# Patient Record
Sex: Male | Born: 1970 | Race: Black or African American | Hispanic: No | Marital: Married | Smoking: Never smoker
Health system: Southern US, Community
[De-identification: ages and names within clinical notes are randomized; demographics above are authoritative.]

---

## 2015-07-29 ENCOUNTER — Telehealth: Payer: Self-pay | Admitting: Behavioral Health

## 2015-07-29 NOTE — Telephone Encounter (Signed)
Unable to reach patient at time of Pre-Visit Call.  Left message for patient to return call when available.    

## 2015-08-01 ENCOUNTER — Ambulatory Visit (INDEPENDENT_AMBULATORY_CARE_PROVIDER_SITE_OTHER): Payer: BLUE CROSS/BLUE SHIELD | Admitting: Medical

## 2015-08-01 ENCOUNTER — Telehealth: Payer: Self-pay | Admitting: Medical

## 2015-08-01 ENCOUNTER — Encounter: Payer: Self-pay | Admitting: Medical

## 2015-08-01 VITALS — BP 110/70 | HR 67 | Temp 97.4°F | Ht 67.0 in | Wt 181.8 lb

## 2015-08-01 DIAGNOSIS — Z0000 Encounter for general adult medical examination without abnormal findings: Secondary | ICD-10-CM

## 2015-08-01 DIAGNOSIS — Z113 Encounter for screening for infections with a predominantly sexual mode of transmission: Secondary | ICD-10-CM

## 2015-08-01 LAB — LIPID PANEL
CHOL/HDL RATIO: 4
Cholesterol: 184 mg/dL (ref 0–200)
HDL: 50.4 mg/dL (ref >39.00)
LDL Cholesterol: 113 mg/dL — ABNORMAL HIGH (ref 0–99)
NONHDL: 133.58
TRIGLYCERIDES: 105 mg/dL (ref 0.0–149.0)
VLDL: 21 mg/dL (ref 0.0–40.0)

## 2015-08-01 LAB — COMPREHENSIVE METABOLIC PANEL
ALBUMIN: 4.5 g/dL (ref 3.5–5.2)
ALK PHOS: 39 U/L (ref 39–117)
ALT: 26 U/L (ref 0–53)
AST: 26 U/L (ref 0–37)
BILIRUBIN TOTAL: 0.9 mg/dL (ref 0.2–1.2)
BUN: 10 mg/dL (ref 6–23)
CO2: 29 mEq/L (ref 19–32)
Calcium: 9.6 mg/dL (ref 8.4–10.5)
Chloride: 104 mEq/L (ref 96–112)
Creatinine, Ser: 1.19 mg/dL (ref 0.40–1.50)
GFR: 84.83 mL/min (ref >60.00)
Glucose, Bld: 95 mg/dL (ref 70–99)
POTASSIUM: 4 meq/L (ref 3.5–5.1)
Sodium: 139 mEq/L (ref 135–145)
TOTAL PROTEIN: 7.3 g/dL (ref 6.0–8.3)

## 2015-08-01 LAB — CBC WITH DIFFERENTIAL/PLATELET
Basophils Absolute: 0 10*3/uL (ref 0.0–0.1)
Basophils Relative: 0.8 % (ref 0.0–3.0)
EOS PCT: 1.5 % (ref 0.0–5.0)
Eosinophils Absolute: 0.1 10*3/uL (ref 0.0–0.7)
HEMATOCRIT: 45.2 % (ref 39.0–52.0)
HEMOGLOBIN: 14.4 g/dL (ref 13.0–17.0)
LYMPHS ABS: 1.6 10*3/uL (ref 0.7–4.0)
LYMPHS PCT: 42.6 % (ref 12.0–46.0)
MCHC: 31.8 g/dL (ref 30.0–36.0)
MCV: 83.4 fl (ref 78.0–100.0)
MONOS PCT: 7 % (ref 3.0–12.0)
Monocytes Absolute: 0.3 10*3/uL (ref 0.1–1.0)
Neutro Abs: 1.8 10*3/uL (ref 1.4–7.7)
Neutrophils Relative %: 48.1 % (ref 43.0–77.0)
Platelets: 192 10*3/uL (ref 150.0–400.0)
RBC: 5.42 Mil/uL (ref 4.22–5.81)
RDW: 13.9 % (ref 11.5–15.5)
WBC: 3.8 10*3/uL — AB (ref 4.0–10.5)

## 2015-08-01 LAB — HIV ANTIBODY (ROUTINE TESTING W REFLEX): HIV 1&2 Ab, 4th Generation: NONREACTIVE

## 2015-08-01 LAB — TSH: TSH: 1.41 u[IU]/mL (ref 0.35–4.50)

## 2015-08-01 NOTE — Progress Notes (Signed)
Pre visit review using our clinic review tool, if applicable. No additional management support is needed unless otherwise documented below in the visit note. 

## 2015-08-01 NOTE — Patient Instructions (Addendum)
Wellness examination Cbc, cmp, tsh, lipid, hiv, ua today.   Pt will get tdap today. Hiv screening as well. Put in referral to eye MD.   Follow up date to be determined post lab review.  Preventive Care for Adults, Male A healthy lifestyle and preventive care can promote health and wellness. Preventive health guidelines for men include the following key practices:  A routine yearly physical is a good way to check with your health care provider about your health and preventative screening. It is a chance to share any concerns and updates on your health and to receive a thorough exam.  Visit your dentist for a routine exam and preventative care every 6 months. Brush your teeth twice a day and floss once a day. Good oral hygiene prevents tooth decay and gum disease.  The frequency of eye exams is based on your age, health, family medical history, use of contact lenses, and other factors. Follow your health care provider's recommendations for frequency of eye exams.  Eat a healthy diet. Foods such as vegetables, fruits, whole grains, low-fat dairy products, and lean protein foods contain the nutrients you need without too many calories. Decrease your intake of foods high in solid fats, added sugars, and salt. Eat the right amount of calories for you.Get information about a proper diet from your health care provider, if necessary.  Regular physical exercise is one of the most important things you can do for your health. Most adults should get at least 150 minutes of moderate-intensity exercise (any activity that increases your heart rate and causes you to sweat) each week. In addition, most adults need muscle-strengthening exercises on 2 or more days a week.  Maintain a healthy weight. The body mass index (BMI) is a screening tool to identify possible weight problems. It provides an estimate of body fat based on height and weight. Your health care provider can find your BMI and can help you achieve  or maintain a healthy weight.For adults 20 years and older:  A BMI below 18.5 is considered underweight.  A BMI of 18.5 to 24.9 is normal.  A BMI of 25 to 29.9 is considered overweight.  A BMI of 30 and above is considered obese.  Maintain normal blood lipids and cholesterol levels by exercising and minimizing your intake of saturated fat. Eat a balanced diet with plenty of fruit and vegetables. Blood tests for lipids and cholesterol should begin at age 26 and be repeated every 5 years. If your lipid or cholesterol levels are high, you are over 50, or you are at high risk for heart disease, you may need your cholesterol levels checked more frequently.Ongoing high lipid and cholesterol levels should be treated with medicines if diet and exercise are not working.  If you smoke, find out from your health care provider how to quit. If you do not use tobacco, do not start.  Lung cancer screening is recommended for adults aged 39-80 years who are at high risk for developing lung cancer because of a history of smoking. A yearly low-dose CT scan of the lungs is recommended for people who have at least a 30-pack-year history of smoking and are a current smoker or have quit within the past 15 years. A pack year of smoking is smoking an average of 1 pack of cigarettes a day for 1 year (for example: 1 pack a day for 30 years or 2 packs a day for 15 years). Yearly screening should continue until the smoker has stopped  smoking for at least 15 years. Yearly screening should be stopped for people who develop a health problem that would prevent them from having lung cancer treatment.  If you choose to drink alcohol, do not have more than 2 drinks per day. One drink is considered to be 12 ounces (355 mL) of beer, 5 ounces (148 mL) of wine, or 1.5 ounces (44 mL) of liquor.  Avoid use of street drugs. Do not share needles with anyone. Ask for help if you need support or instructions about stopping the use of  drugs.  High blood pressure causes heart disease and increases the risk of stroke. Your blood pressure should be checked at least every 1-2 years. Ongoing high blood pressure should be treated with medicines, if weight loss and exercise are not effective.  If you are 59-7 years old, ask your health care provider if you should take aspirin to prevent heart disease.  Diabetes screening is done by taking a blood sample to check your blood glucose level after you have not eaten for a certain period of time (fasting). If you are not overweight and you do not have risk factors for diabetes, you should be screened once every 3 years starting at age 73. If you are overweight or obese and you are 42-60 years of age, you should be screened for diabetes every year as part of your cardiovascular risk assessment.  Colorectal cancer can be detected and often prevented. Most routine colorectal cancer screening begins at the age of 73 and continues through age 60. However, your health care provider may recommend screening at an earlier age if you have risk factors for colon cancer. On a yearly basis, your health care provider may provide home test kits to check for hidden blood in the stool. Use of a small camera at the end of a tube to directly examine the colon (sigmoidoscopy or colonoscopy) can detect the earliest forms of colorectal cancer. Talk to your health care provider about this at age 42, when routine screening begins. Direct exam of the colon should be repeated every 5-10 years through age 44, unless early forms of precancerous polyps or small growths are found.  People who are at an increased risk for hepatitis B should be screened for this virus. You are considered at high risk for hepatitis B if:  You were born in a country where hepatitis B occurs often. Talk with your health care provider about which countries are considered high risk.  Your parents were born in a high-risk country and you have not  received a shot to protect against hepatitis B (hepatitis B vaccine).  You have HIV or AIDS.  You use needles to inject street drugs.  You live with, or have sex with, someone who has hepatitis B.  You are a man who has sex with other men (MSM).  You get hemodialysis treatment.  You take certain medicines for conditions such as cancer, organ transplantation, and autoimmune conditions.  Hepatitis C blood testing is recommended for all people born from [birth year] through 1965 and any individual with known risks for hepatitis C.  Practice safe sex. Use condoms and avoid high-risk sexual practices to reduce the spread of sexually transmitted infections (STIs). STIs include gonorrhea, chlamydia, syphilis, trichomonas, herpes, HPV, and human immunodeficiency virus (HIV). Herpes, HIV, and HPV are viral illnesses that have no cure. They can result in disability, cancer, and death.  If you are a man who has sex with other men, you should be  screened at least once per year for:  HIV.  Urethral, rectal, and pharyngeal infection of gonorrhea, chlamydia, or both.  If you are at risk of being infected with HIV, it is recommended that you take a prescription medicine daily to prevent HIV infection. This is called preexposure prophylaxis (PrEP). You are considered at risk if:  You are a man who has sex with other men (MSM) and have other risk factors.  You are a heterosexual man, are sexually active, and are at increased risk for HIV infection.  You take drugs by injection.  You are sexually active with a partner who has HIV.  Talk with your health care provider about whether you are at high risk of being infected with HIV. If you choose to begin PrEP, you should first be tested for HIV. You should then be tested every 3 months for as long as you are taking PrEP.  A one-time screening for abdominal aortic aneurysm (AAA) and surgical repair of large AAAs by ultrasound are recommended for men ages 74 to  41 years who are current or former smokers.  Healthy men should no longer receive prostate-specific antigen (PSA) blood tests as part of routine cancer screening. Talk with your health care provider about prostate cancer screening.  Testicular cancer screening is not recommended for adult males who have no symptoms. Screening includes self-exam, a health care provider exam, and other screening tests. Consult with your health care provider about any symptoms you have or any concerns you have about testicular cancer.  Use sunscreen. Apply sunscreen liberally and repeatedly throughout the day. You should seek shade when your shadow is shorter than you. Protect yourself by wearing long sleeves, pants, a wide-brimmed hat, and sunglasses year round, whenever you are outdoors.  Once a month, do a whole-body skin exam, using a mirror to look at the skin on your back. Tell your health care provider about new moles, moles that have irregular borders, moles that are larger than a pencil eraser, or moles that have changed in shape or color.  Stay current with required vaccines (immunizations).  Influenza vaccine. All adults should be immunized every year.  Tetanus, diphtheria, and acellular pertussis (Td, Tdap) vaccine. An adult who has not previously received Tdap or who does not know his vaccine status should receive 1 dose of Tdap. This initial dose should be followed by tetanus and diphtheria toxoids (Td) booster doses every 10 years. Adults with an unknown or incomplete history of completing a 3-dose immunization series with Td-containing vaccines should begin or complete a primary immunization series including a Tdap dose. Adults should receive a Td booster every 10 years.  Varicella vaccine. An adult without evidence of immunity to varicella should receive 2 doses or a second dose if he has previously received 1 dose.  Human papillomavirus (HPV) vaccine. Males aged 11-21 years who have not received the  vaccine previously should receive the 3-dose series. Males aged 22-26 years may be immunized. Immunization is recommended through the age of 20 years for any male who has sex with males and did not get any or all doses earlier. Immunization is recommended for any person with an immunocompromised condition through the age of 67 years if he did not get any or all doses earlier. During the 3-dose series, the second dose should be obtained 4-8 weeks after the first dose. The third dose should be obtained 24 weeks after the first dose and 16 weeks after the second dose.  Zoster vaccine. One  dose is recommended for adults aged 63 years or older unless certain conditions are present.  Measles, mumps, and rubella (MMR) vaccine. Adults born before [birth year] generally are considered immune to measles and mumps. Adults born in [birth year] or later should have 1 or more doses of MMR vaccine unless there is a contraindication to the vaccine or there is laboratory evidence of immunity to each of the three diseases. A routine second dose of MMR vaccine should be obtained at least 28 days after the first dose for students attending postsecondary schools, health care workers, or international travelers. People who received inactivated measles vaccine or an unknown type of measles vaccine during 1963-1967 should receive 2 doses of MMR vaccine. People who received inactivated mumps vaccine or an unknown type of mumps vaccine before 1979 and are at high risk for mumps infection should consider immunization with 2 doses of MMR vaccine. Unvaccinated health care workers born before [birth year] who lack laboratory evidence of measles, mumps, or rubella immunity or laboratory confirmation of disease should consider measles and mumps immunization with 2 doses of MMR vaccine or rubella immunization with 1 dose of MMR vaccine.  Pneumococcal 13-valent conjugate (PCV13) vaccine. When indicated, a person who is uncertain of his immunization history and has no  record of immunization should receive the PCV13 vaccine. All adults 36 years of age and older should receive this vaccine. An adult aged 61 years or older who has certain medical conditions and has not been previously immunized should receive 1 dose of PCV13 vaccine. This PCV13 should be followed with a dose of pneumococcal polysaccharide (PPSV23) vaccine. Adults who are at high risk for pneumococcal disease should obtain the PPSV23 vaccine at least 8 weeks after the dose of PCV13 vaccine. Adults older than 46 years of age who have normal immune system function should obtain the PPSV23 vaccine dose at least 1 year after the dose of PCV13 vaccine.  Pneumococcal polysaccharide (PPSV23) vaccine. When PCV13 is also indicated, PCV13 should be obtained first. All adults aged 44 years and older should be immunized. An adult younger than age 51 years who has certain medical conditions should be immunized. Any person who resides in a nursing home or long-term care facility should be immunized. An adult smoker should be immunized. People with an immunocompromised condition and certain other conditions should receive both PCV13 and PPSV23 vaccines. People with human immunodeficiency virus (HIV) infection should be immunized as soon as possible after diagnosis. Immunization during chemotherapy or radiation therapy should be avoided. Routine use of PPSV23 vaccine is not recommended for American Indians, [place] Natives, or people younger than 65 years unless there are medical conditions that require PPSV23 vaccine. When indicated, people who have unknown immunization and have no record of immunization should receive PPSV23 vaccine. One-time revaccination 5 years after the first dose of PPSV23 is recommended for people aged 19-64 years who have chronic kidney failure, nephrotic syndrome, asplenia, or immunocompromised conditions. People who received 1-2 doses of PPSV23 before age 73 years should receive another dose of PPSV23  vaccine at age 71 years or later if at least 5 years have passed since the previous dose. Doses of PPSV23 are not needed for people immunized with PPSV23 at or after age 51 years.  Meningococcal vaccine. Adults with asplenia or persistent complement component deficiencies should receive 2 doses of quadrivalent meningococcal conjugate (MenACWY-D) vaccine. The doses should be obtained at least 2 months apart. Microbiologists working with certain meningococcal bacteria, [place] recruits, people at risk during an  outbreak, and people who travel to or live in countries with a high rate of meningitis should be immunized. A first-year college student up through age 66 years who is living in a residence hall should receive a dose if he did not receive a dose on or after his 16th birthday. Adults who have certain high-risk conditions should receive one or more doses of vaccine.  Hepatitis A vaccine. Adults who wish to be protected from this disease, have chronic liver disease, work with hepatitis A-infected animals, work in hepatitis A research labs, or travel to or work in countries with a high rate of hepatitis A should be immunized. Adults who were previously unvaccinated and who anticipate close contact with an international adoptee during the first 60 days after arrival in the [place] from a country with a high rate of hepatitis A should be immunized.  Hepatitis B vaccine. Adults should be immunized if they wish to be protected from this disease, are under age 81 years and have diabetes, have chronic liver disease, have had more than one sex partner in the past 6 months, may be exposed to blood or other infectious body fluids, are household contacts or sex partners of hepatitis B positive people, are clients or workers in certain care facilities, or travel to or work in countries with a high rate of hepatitis B.  Haemophilus influenzae type b (Hib) vaccine. A previously unvaccinated person with asplenia  or sickle cell disease or having a scheduled splenectomy should receive 1 dose of Hib vaccine. Regardless of previous immunization, a recipient of a hematopoietic stem cell transplant should receive a 3-dose series 6-12 months after his successful transplant. Hib vaccine is not recommended for adults with HIV infection. Preventive Service / Frequency Ages 1 to 14  Blood pressure check.** / Every 3-5 years.  Lipid and cholesterol check.** / Every 5 years beginning at age 19.  Hepatitis C blood test.** / For any individual with known risks for hepatitis C.  Skin self-exam. / Monthly.  Influenza vaccine. / Every year.  Tetanus, diphtheria, and acellular pertussis (Tdap, Td) vaccine.** / Consult your health care provider. 1 dose of Td every 10 years.  Varicella vaccine.** / Consult your health care provider.  HPV vaccine. / 3 doses over 6 months, if 29 or younger.  Measles, mumps, rubella (MMR) vaccine.** / You need at least 1 dose of MMR if you were born in 1957 or later. You may also need a second dose.  Pneumococcal 13-valent conjugate (PCV13) vaccine.** / Consult your health care provider.  Pneumococcal polysaccharide (PPSV23) vaccine.** / 1 to 2 doses if you smoke cigarettes or if you have certain conditions.  Meningococcal vaccine.** / 1 dose if you are age 11 to 26 years and a Market researcher living in a residence hall, or have one of several medical conditions. You may also need additional booster doses.  Hepatitis A vaccine.** / Consult your health care provider.  Hepatitis B vaccine.** / Consult your health care provider.  Haemophilus influenzae type b (Hib) vaccine.** / Consult your health care provider. Ages 8 to 36  Blood pressure check.** / Every year.  Lipid and cholesterol check.** / Every 5 years beginning at age 4.  Lung cancer screening. / Every year if you are aged 60-80 years and have a 30-pack-year history of smoking and currently smoke or have  quit within the past 15 years. Yearly screening is stopped once you have quit smoking for at least 15 years or develop  a health problem that would prevent you from having lung cancer treatment.  Fecal occult blood test (FOBT) of stool. / Every year beginning at age 36 and continuing until age 4. You may not have to do this test if you get a colonoscopy every 10 years.  Flexible sigmoidoscopy** or colonoscopy.** / Every 5 years for a flexible sigmoidoscopy or every 10 years for a colonoscopy beginning at age 72 and continuing until age 6.  Hepatitis C blood test.** / For all people born from [birth year] through 1965 and any individual with known risks for hepatitis C.  Skin self-exam. / Monthly.  Influenza vaccine. / Every year.  Tetanus, diphtheria, and acellular pertussis (Tdap/Td) vaccine.** / Consult your health care provider. 1 dose of Td every 10 years.  Varicella vaccine.** / Consult your health care provider.  Zoster vaccine.** / 1 dose for adults aged 62 years or older.  Measles, mumps, rubella (MMR) vaccine.** / You need at least 1 dose of MMR if you were born in 1957 or later. You may also need a second dose.  Pneumococcal 13-valent conjugate (PCV13) vaccine.** / Consult your health care provider.  Pneumococcal polysaccharide (PPSV23) vaccine.** / 1 to 2 doses if you smoke cigarettes or if you have certain conditions.  Meningococcal vaccine.** / Consult your health care provider.  Hepatitis A vaccine.** / Consult your health care provider.  Hepatitis B vaccine.** / Consult your health care provider.  Haemophilus influenzae type b (Hib) vaccine.** / Consult your health care provider. Ages 62 and over  Blood pressure check.** / Every year.  Lipid and cholesterol check.**/ Every 5 years beginning at age 93.  Lung cancer screening. / Every year if you are aged 1-80 years and have a 30-pack-year history of smoking and currently smoke or have quit within the past 15 years. Yearly  screening is stopped once you have quit smoking for at least 15 years or develop a health problem that would prevent you from having lung cancer treatment.  Fecal occult blood test (FOBT) of stool. / Every year beginning at age 17 and continuing until age 21. You may not have to do this test if you get a colonoscopy every 10 years.  Flexible sigmoidoscopy** or colonoscopy.** / Every 5 years for a flexible sigmoidoscopy or every 10 years for a colonoscopy beginning at age 74 and continuing until age 45.  Hepatitis C blood test.** / For all people born from [birth year] through 1965 and any individual with known risks for hepatitis C.  Abdominal aortic aneurysm (AAA) screening.** / A one-time screening for ages 11 to 22 years who are current or former smokers.  Skin self-exam. / Monthly.  Influenza vaccine. / Every year.  Tetanus, diphtheria, and acellular pertussis (Tdap/Td) vaccine.** / 1 dose of Td every 10 years.  Varicella vaccine.** / Consult your health care provider.  Zoster vaccine.** / 1 dose for adults aged 42 years or older.  Pneumococcal 13-valent conjugate (PCV13) vaccine.** / 1 dose for all adults aged 14 years and older.  Pneumococcal polysaccharide (PPSV23) vaccine.** / 1 dose for all adults aged 30 years and older.  Meningococcal vaccine.** / Consult your health care provider.  Hepatitis A vaccine.** / Consult your health care provider.  Hepatitis B vaccine.** / Consult your health care provider.  Haemophilus influenzae type b (Hib) vaccine.** / Consult your health care provider. **Family history and personal history of risk and conditions may change your health care provider's recommendations.   This information is not intended to replace  advice given to you by your health care provider. Make sure you discuss any questions you have with your health care provider.   Document Released: 07/31/2001 Document Revised: 06/25/2014 Document Reviewed: 10/30/2010 Elsevier  Interactive Patient Education [employer].

## 2015-08-01 NOTE — Progress Notes (Signed)
   Subjective:    Patient ID: Ronald Mann, male    DOB: [birth year], 45 y.o.   MRN: [id number]  HPI  I have reviewed pt PMH, PSH, FH, Social History and Surgical History.  Pt is a truck IT sales professional, pt works out twice a week jogging, Pt states he eats healthy, married- 3 children  Pt declines flu vaccine.  Pt willing to get tdap.  Will get hiv screening today.  No acute problems on first visit here. Will go ahead and do physical.  No family history of prostate cancer. No colon cancer history.       Review of Systems  Constitutional: Negative for fever, chills and fatigue.  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  Respiratory: Negative for cough, shortness of breath and wheezing.   Cardiovascular: Negative for chest pain and palpitations.  Gastrointestinal: Negative for nausea, vomiting, abdominal pain, diarrhea, constipation and anal bleeding.  Genitourinary: Negative for dysuria, frequency and flank pain.  Musculoskeletal: Negative for back pain.  Neurological: Negative for dizziness and headaches.  Hematological: Negative for adenopathy. Does not bruise/bleed easily.  Psychiatric/Behavioral: Negative for behavioral problems, confusion and dysphoric mood. The patient is not nervous/anxious.        Objective:   Physical Exam   General Mental Status- Alert. General Appearance- Not in acute distress.   Eyes- Peerl.(both side) Rt eye pterygium.  Skin General: Color- Normal Color. Moisture- Normal Moisture.  Neck Carotid Arteries- Normal color. Moisture- Normal Moisture. No carotid bruits. No JVD.  Chest and Lung Exam Auscultation: Breath Sounds:-Normal.  Cardiovascular Auscultation:Rythm- Regular. Murmurs & Other Heart Sounds:Auscultation of the heart reveals- No Murmurs.  Abdomen Inspection:-Inspeection Normal. Palpation/Percussion:Note:No mass. Palpation and Percussion of the abdomen reveal- Non Tender, Non Distended + BS, no  rebound or guarding.    Neurologic Cranial Nerve exam:- CN III-XII intact(No nystagmus), symmetric smile. Strength:- 5/5 equal and symmetric strength both upper and lower extremities.   Male Genitourinary Urethra:- No discharge. Penis- Circumcised. Scrotum- No masses. Testes- Bilateral-Normal. No hernias on exam.         Assessment & Plan:

## 2015-08-01 NOTE — Assessment & Plan Note (Addendum)
Cbc, cmp, tsh, lipid, hiv, ua today.

## 2015-08-02 NOTE — Telephone Encounter (Signed)
Opened in error

## 2018-09-19 ENCOUNTER — Telehealth: Payer: Self-pay

## 2018-09-19 NOTE — Telephone Encounter (Signed)
LMOM last visit in 07/2015- would he like to re-establish care? Getting care elsewhere? Asked that he return call at his convenience.

## 2019-10-25 ENCOUNTER — Emergency Department (HOSPITAL_BASED_OUTPATIENT_CLINIC_OR_DEPARTMENT_OTHER): Payer: 59

## 2019-10-25 ENCOUNTER — Encounter (HOSPITAL_BASED_OUTPATIENT_CLINIC_OR_DEPARTMENT_OTHER): Payer: Self-pay | Admitting: *Deleted

## 2019-10-25 ENCOUNTER — Other Ambulatory Visit: Payer: Self-pay

## 2019-10-25 ENCOUNTER — Emergency Department (HOSPITAL_BASED_OUTPATIENT_CLINIC_OR_DEPARTMENT_OTHER)
Admission: EM | Admit: 2019-10-25 | Discharge: 2019-10-25 | Disposition: A | Discharge status: Home / Self Care | Payer: 59 | Attending: Emergency Medicine | Admitting: Emergency Medicine

## 2019-10-25 DIAGNOSIS — X58XXXA Exposure to other specified factors, initial encounter: Secondary | ICD-10-CM | POA: Insufficient documentation

## 2019-10-25 DIAGNOSIS — Y92322 Soccer field as the place of occurrence of the external cause: Secondary | ICD-10-CM | POA: Insufficient documentation

## 2019-10-25 DIAGNOSIS — Y9366 Activity, soccer: Secondary | ICD-10-CM | POA: Diagnosis not present

## 2019-10-25 DIAGNOSIS — Y999 Unspecified external cause status: Secondary | ICD-10-CM | POA: Insufficient documentation

## 2019-10-25 DIAGNOSIS — S99921A Unspecified injury of right foot, initial encounter: Secondary | ICD-10-CM | POA: Diagnosis present

## 2019-10-25 DIAGNOSIS — S93601A Unspecified sprain of right foot, initial encounter: Secondary | ICD-10-CM | POA: Insufficient documentation

## 2019-10-25 MED ORDER — IBUPROFEN 800 MG PO TABS: 800.0000 mg | ORAL_TABLET | Freq: Three times a day (TID) | ORAL | 0 refills | Status: AC | PRN

## 2019-10-25 NOTE — ED Provider Notes (Signed)
[facility] EMERGENCY DEPARTMENT Provider Note  CSN: [id number] Arrival date & time: 10/25/19 1035    History Chief Complaint  Patient presents with  . Foot Pain    HPI  Ronald Mann is a 49 y.o. male reports about 2 weeks ago he was playing soccer. He did not notice a specific injury, but afterwards began to have some pain in his R foot. He rested for a few days but when he was playing soccer again yesterday he noticed the pain was still there and was concerned about a more serious injury. He denies any other concerns today.    History reviewed. No pertinent past medical history.  History reviewed. No pertinent surgical history.  History reviewed. No pertinent family history.  Social History   Tobacco Use  . Smoking status: Never Smoker  . Smokeless tobacco: Never Used  Substance Use Topics  . Alcohol use: No    Alcohol/week: 0.0 standard drinks  . Drug use: No     Home Medications Prior to Admission medications   Medication Sig Start Date End Date Taking? Authorizing Provider  ibuprofen (ADVIL) 800 MG tablet Take 1 tablet (800 mg total) by mouth every 8 (eight) hours as needed for moderate pain. 10/25/19   Pollyann Savoy, MD     Allergies    Patient has no known allergies.   Review of Systems   Review of Systems  Constitutional: Negative for fever.  Respiratory: Negative for shortness of breath.   Cardiovascular: Negative for chest pain.  Musculoskeletal: Positive for arthralgias. Negative for joint swelling.     Physical Exam BP 117/81 (BP Location: Right Arm)   Pulse (!) 48   Temp 98 F (36.7 C) (Oral)   Resp 16   Ht 5\' 8"  (1.727 m)   Wt 86.2 kg   SpO2 100%   BMI 28.89 kg/m   Physical Exam Vitals and nursing note reviewed.  HENT:     Head: Normocephalic.     Nose: Nose normal.  Eyes:     Extraocular Movements: Extraocular movements intact.  Cardiovascular:     Pulses: Normal pulses.  Pulmonary:     Effort: Pulmonary effort is  normal.  Musculoskeletal:        General: Tenderness (mild, over the 3rd MTP joint) present. No swelling or deformity. Normal range of motion.     Cervical back: Neck supple.  Skin:    Findings: No rash (on exposed skin).  Neurological:     Mental Status: He is alert and oriented to person, place, and time.  Psychiatric:        Mood and Affect: Mood normal.      ED Results / Procedures / Treatments   Labs (all labs ordered are listed, but only abnormal results are displayed) Labs Reviewed - No data to display  EKG None   Radiology DG Foot Complete Right  Result Date: 10/25/2019 CLINICAL DATA:  Injury while playing soccer EXAM: RIGHT FOOT COMPLETE - 3+ VIEW COMPARISON:  None. FINDINGS: Frontal, oblique, and lateral views were obtained. No fracture or dislocation. Joint spaces appear unremarkable. There is mild bony overgrowth along the distal first metatarsal with a degree of bunion formation in this area. There is a small inferior calcaneal spur. IMPRESSION: No fracture or dislocation. Mild bony overgrowth along the distal first metatarsal with apparent bunion formation medially. No appreciable joint space narrowing or erosion. Small inferior calcaneal spur. Electronically Signed   By: 12/25/2019 III M.D.  On: 10/25/2019 11:24    Procedures Procedures  Medications Ordered in the ED Medications - No data to display   ED Course  I have reviewed the triage vital signs and the nursing notes.  Pertinent labs & imaging results that were available during my care of the patient were reviewed by me and considered in my medical decision making (see chart for details).  Clinical Course as of Oct 24 1412  Sun Oct 25, 2019  1144 Xray images and results reviewed. Plan discharge with Post-op shoe, NSAIDs, rest and PCP followup.    [CS]    Clinical Course User Index [CS] Truddie Hidden, MD    MDM Rules/Calculators/A&P MDM  Final Clinical Impression(s) / ED  Diagnoses Final diagnoses:  Sprain of right foot, initial encounter    Rx / DC Orders ED Discharge Orders         Ordered    ibuprofen (ADVIL) 800 MG tablet  Every 8 hours PRN     10/25/19 1146           Truddie Hidden, MD 10/25/19 1414

## 2019-10-25 NOTE — ED Triage Notes (Signed)
Presents with right foot pain, onset approx 2-3 weeks, playing soccer and had pain, not sure if had injury during match. Pain across the top, Pain has progressively gotten worse per pt statement, was able to ambulate to exam room. States he has OTC meds, is requesting a foot x-ray

## 2021-03-06 ENCOUNTER — Other Ambulatory Visit: Payer: Self-pay

## 2021-03-06 ENCOUNTER — Encounter (INDEPENDENT_AMBULATORY_CARE_PROVIDER_SITE_OTHER): Payer: 59 | Admitting: Ophthalmology

## 2021-03-06 DIAGNOSIS — H338 Other retinal detachments: Secondary | ICD-10-CM | POA: Diagnosis not present

## 2021-03-06 DIAGNOSIS — H43811 Vitreous degeneration, right eye: Secondary | ICD-10-CM | POA: Diagnosis not present

## 2021-03-06 DIAGNOSIS — H31002 Unspecified chorioretinal scars, left eye: Secondary | ICD-10-CM | POA: Diagnosis not present

## 2021-03-06 DIAGNOSIS — H2513 Age-related nuclear cataract, bilateral: Secondary | ICD-10-CM | POA: Diagnosis not present

## 2021-04-05 ENCOUNTER — Encounter (INDEPENDENT_AMBULATORY_CARE_PROVIDER_SITE_OTHER): Payer: 59 | Admitting: Ophthalmology

## 2021-05-24 ENCOUNTER — Encounter (INDEPENDENT_AMBULATORY_CARE_PROVIDER_SITE_OTHER): Payer: 59 | Admitting: Ophthalmology

## 2021-08-05 IMAGING — DX DG FOOT COMPLETE 3+V*R*
3 series · 3 of 3 positions shown · non-contrast
Comparison: None.

CLINICAL DATA: Injury while playing soccer

EXAM:
RIGHT FOOT COMPLETE - 3+ VIEW

[foot ap]
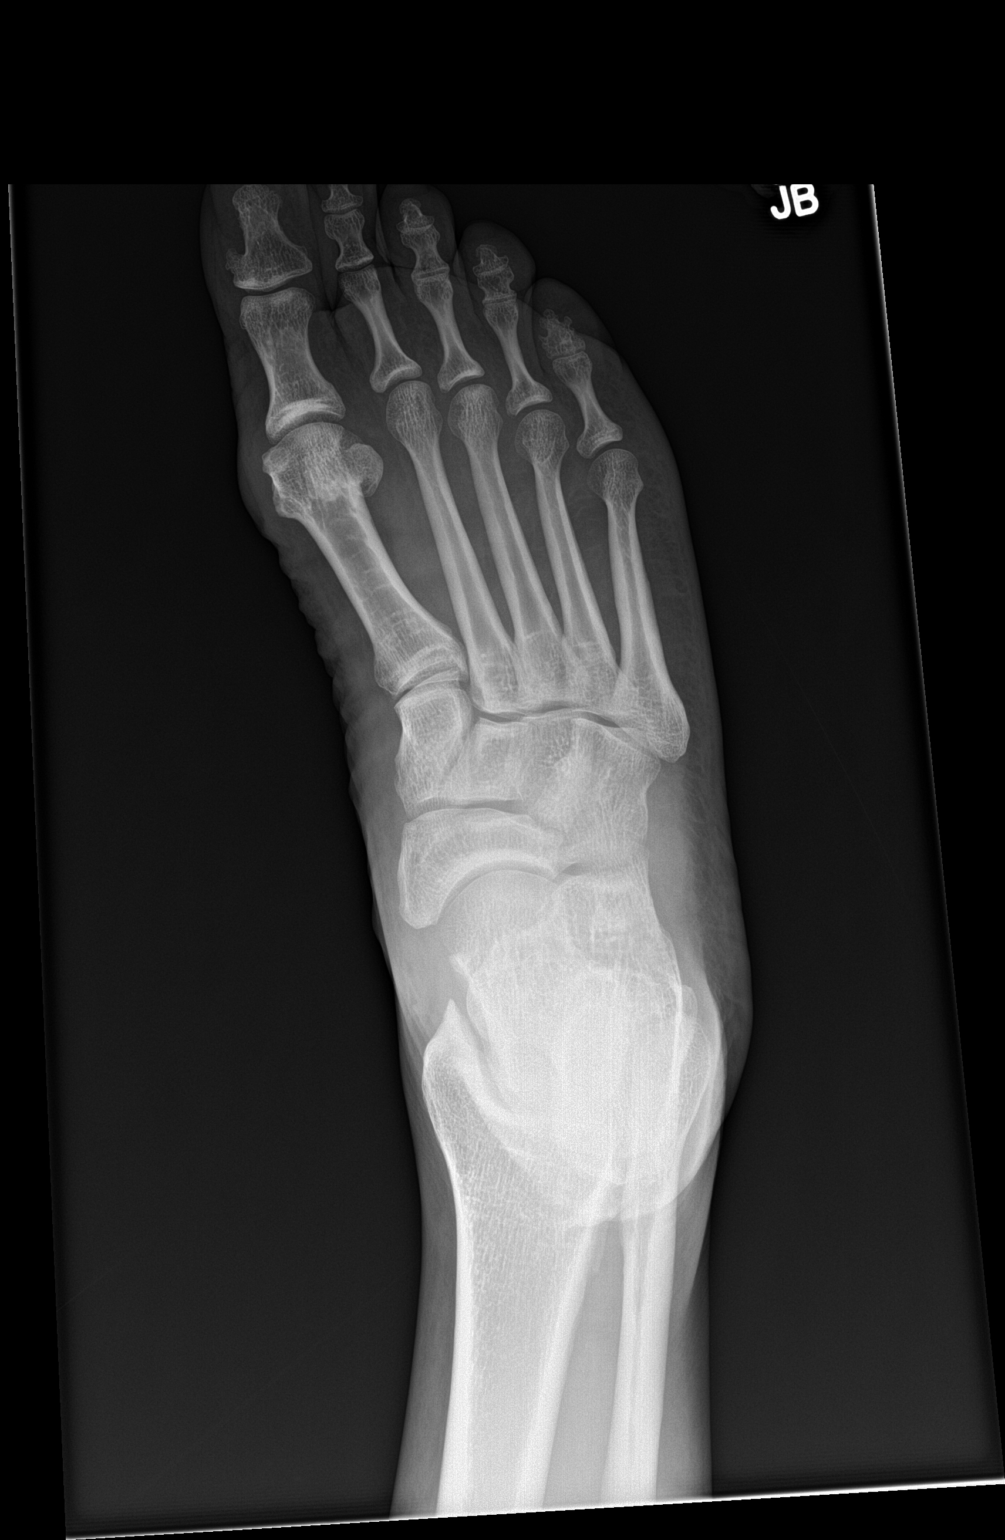

[foot obl]
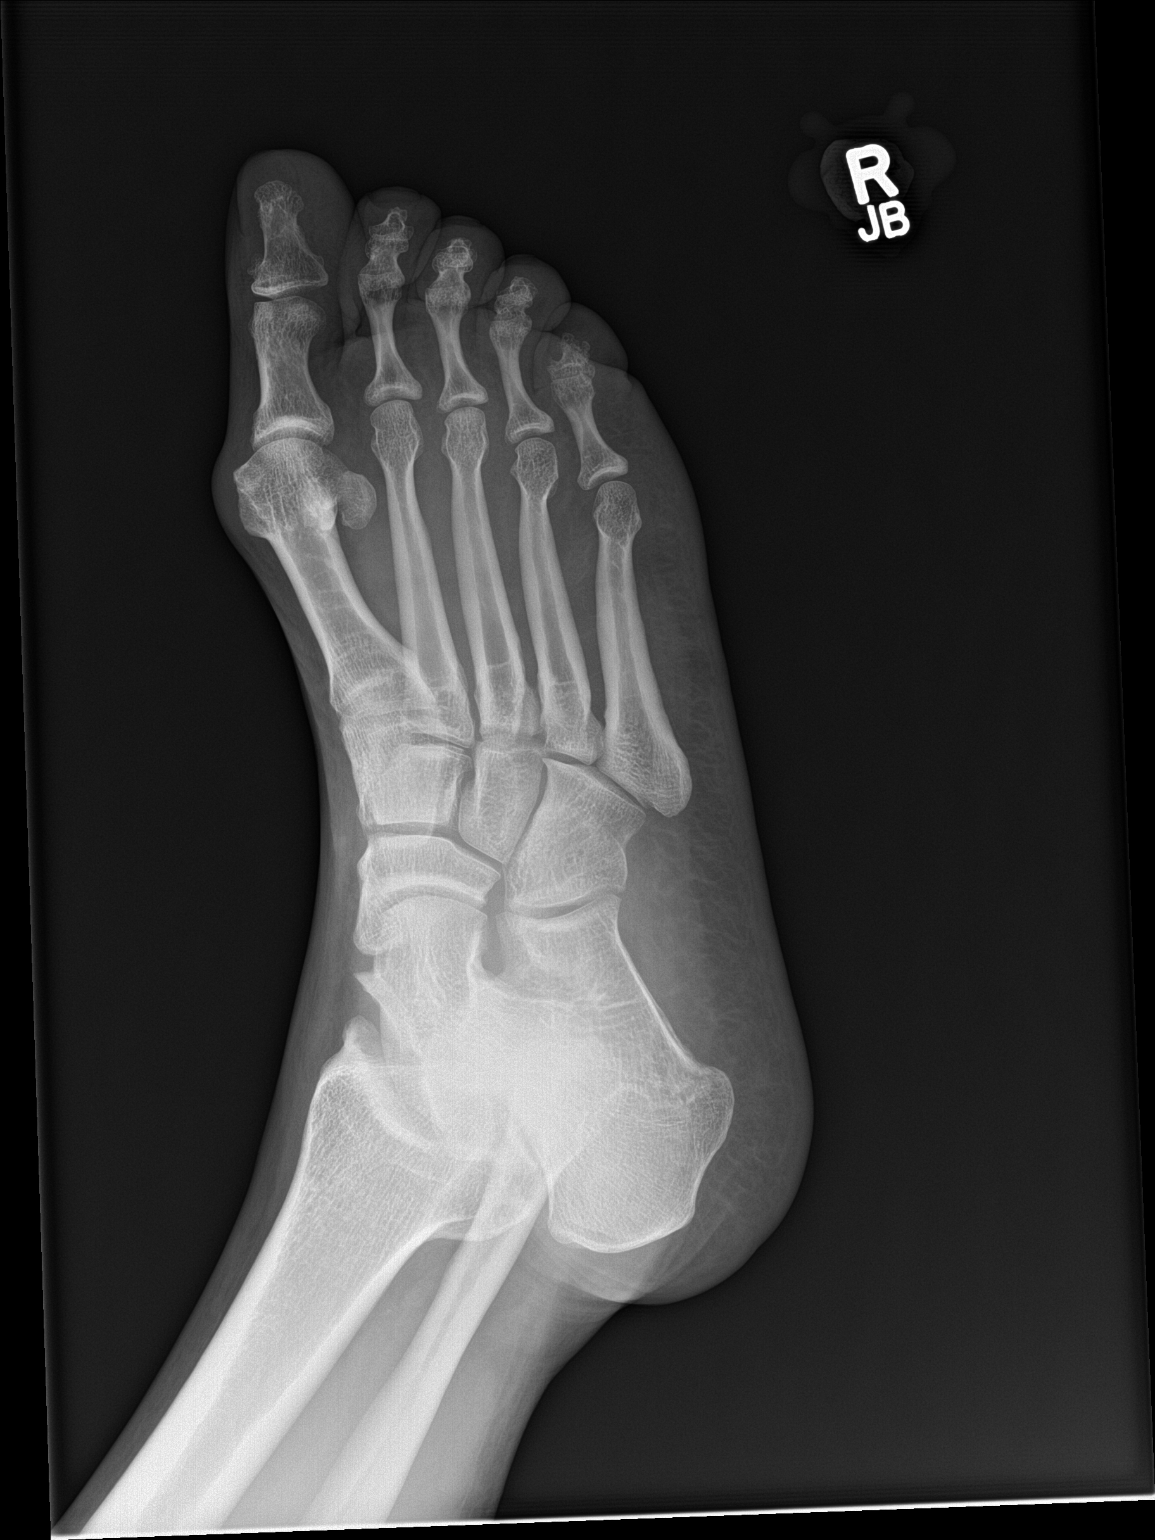

[foot lat]
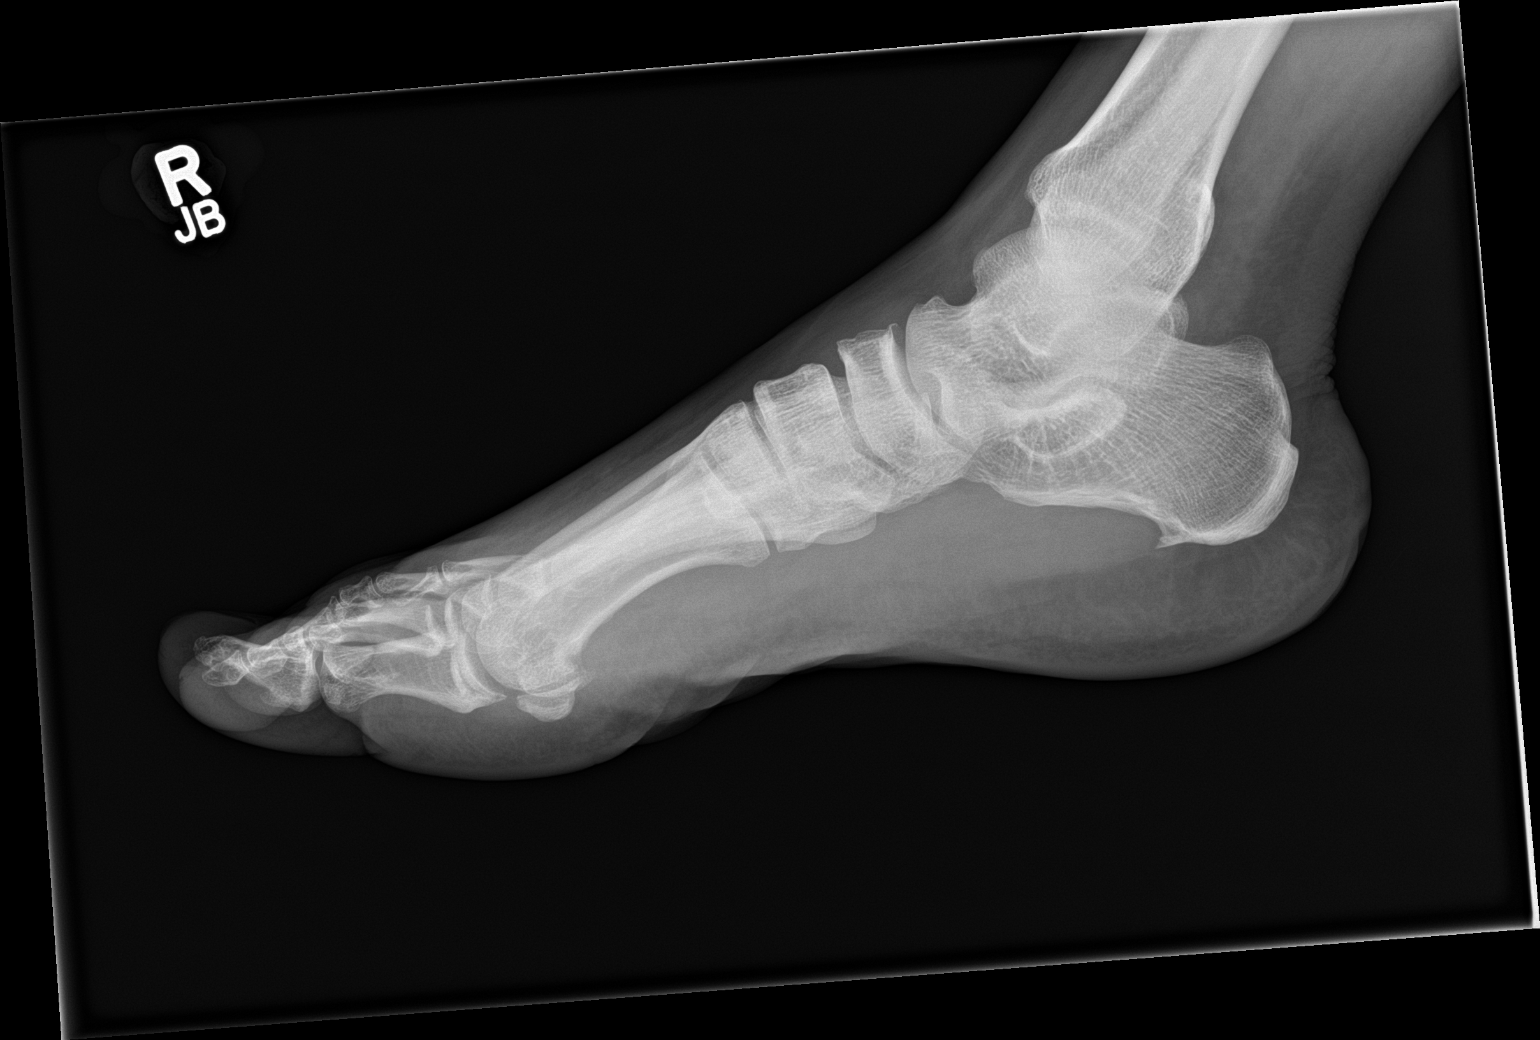

[3 of 3 positions shown; findings below may reference images not displayed]

FINDINGS: Frontal, oblique, and lateral views were obtained. No fracture or
dislocation. Joint spaces appear unremarkable. There is mild bony
overgrowth along the distal first metatarsal with a degree of bunion
formation in this area. There is a small inferior calcaneal spur.
IMPRESSION: No fracture or dislocation. Mild bony overgrowth along the distal
first metatarsal with apparent bunion formation medially. No
appreciable joint space narrowing or erosion. Small inferior
calcaneal spur.

## 2022-06-06 ENCOUNTER — Emergency Department
Admission: EM | Admit: 2022-06-06 | Discharge: 2022-06-06 | Disposition: A | Discharge status: Home / Self Care | Payer: Commercial Managed Care - PPO | Attending: Emergency Medicine | Admitting: Emergency Medicine

## 2022-06-06 ENCOUNTER — Emergency Department (EMERGENCY_DEPARTMENT_HOSPITAL): Payer: Commercial Managed Care - PPO

## 2022-06-06 ENCOUNTER — Other Ambulatory Visit: Payer: Self-pay

## 2022-06-06 ENCOUNTER — Emergency Department (HOSPITAL_COMMUNITY): Payer: Commercial Managed Care - PPO

## 2022-06-06 DIAGNOSIS — S70312A Abrasion, left thigh, initial encounter: Secondary | ICD-10-CM | POA: Insufficient documentation

## 2022-06-06 DIAGNOSIS — R9431 Abnormal electrocardiogram [ECG] [EKG]: Secondary | ICD-10-CM | POA: Insufficient documentation

## 2022-06-06 DIAGNOSIS — S0990XA Unspecified injury of head, initial encounter: Secondary | ICD-10-CM

## 2022-06-06 DIAGNOSIS — S40012A Contusion of left shoulder, initial encounter: Secondary | ICD-10-CM

## 2022-06-06 DIAGNOSIS — S60512A Abrasion of left hand, initial encounter: Secondary | ICD-10-CM | POA: Insufficient documentation

## 2022-06-06 LAB — CBC WITH DIFF
BASOPHIL #: <0.1 10*3/uL (ref <=0.20)
BASOPHIL %: 1 %
EOSINOPHIL #: <0.1 10*3/uL (ref <=0.50)
EOSINOPHIL %: 2 %
HCT: 46.5 % (ref 38.9–52.0)
HGB: 14.5 g/dL (ref 13.4–17.5)
IMMATURE GRANULOCYTE #: <0.1 10*3/uL (ref <0.10)
IMMATURE GRANULOCYTE %: 0 % (ref 0.0–1.0)
LYMPHOCYTE #: 2.22 10*3/uL (ref 1.00–4.80)
LYMPHOCYTE %: 59 %
MCH: 26.4 pg (ref 26.0–32.0)
MCHC: 31.2 g/dL (ref 31.0–35.5)
MCV: 84.7 fL (ref 78.0–100.0)
MONOCYTE #: 0.36 10*3/uL (ref 0.20–1.10)
MONOCYTE %: 9 %
MPV: 10.5 fL (ref 8.7–12.5)
NEUTROPHIL #: 1.1 10*3/uL — ABNORMAL LOW (ref 1.50–7.70)
NEUTROPHIL %: 29 %
PLATELETS: 192 10*3/uL (ref 150–400)
RBC: 5.49 10*6/uL (ref 4.50–6.10)
RDW-CV: 12.4 % (ref 11.5–15.5)
WBC: 3.8 10*3/uL (ref 3.7–11.0)

## 2022-06-06 LAB — COMPREHENSIVE METABOLIC PANEL, NON-FASTING
ALBUMIN: 4.3 g/dL (ref 3.5–5.0)
ALKALINE PHOSPHATASE: 53 U/L (ref 45–115)
ALT (SGPT): 22 U/L (ref 10–55)
ANION GAP: 9 mmol/L (ref 4–13)
AST (SGOT): 28 U/L (ref 8–45)
BILIRUBIN TOTAL: 0.8 mg/dL (ref 0.3–1.3)
BUN/CREA RATIO: 9 (ref 6–22)
BUN: 12 mg/dL (ref 8–25)
CALCIUM: 9.5 mg/dL (ref 8.6–10.2)
CHLORIDE: 107 mmol/L (ref 96–111)
CO2 TOTAL: 21 mmol/L — ABNORMAL LOW (ref 22–30)
CREATININE: 1.37 mg/dL — ABNORMAL HIGH (ref 0.75–1.35)
ESTIMATED GFR - MALE: 62 mL/min/BSA (ref >=60)
GLUCOSE: 97 mg/dL (ref 65–125)
POTASSIUM: 4.7 mmol/L (ref 3.5–5.1)
PROTEIN TOTAL: 7.5 g/dL (ref 6.4–8.3)
SODIUM: 137 mmol/L (ref 136–145)

## 2022-06-06 LAB — URINALYSIS WITH MICROSCOPIC REFLEX IF INDICATED BMC/JMC ONLY
BILIRUBIN: NEGATIVE mg/dL
BLOOD: NEGATIVE mg/dL
GLUCOSE: NEGATIVE mg/dL
KETONES: NEGATIVE mg/dL
LEUKOCYTES: NEGATIVE WBCs/uL
NITRITE: NEGATIVE
PH: 6.5 (ref <8.0)
PROTEIN: NEGATIVE mg/dL
SPECIFIC GRAVITY: 1.01 (ref <1.022)
UROBILINOGEN: 0.2 mg/dL (ref <=2.0)

## 2022-06-06 LAB — DARK GREEN TUBE

## 2022-06-06 LAB — ETHANOL, SERUM/PLASMA
ETHANOL: <10 mg/dL (ref <10)
ETHANOL: NOT DETECTED

## 2022-06-06 LAB — RED TOP TUBE

## 2022-06-06 LAB — BLUE TOP TUBE
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
## 2022-06-06 NOTE — ED Nurses Note (Addendum)
Patient given sandwich and drinks. Patient talking with family and aware of plan of care. Patient states he is getting a hotel tonight and his family will pick him up tomorrow. All questions answered. No further concerns.
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
## 2022-06-06 NOTE — ED Nurses Note (Signed)
This nurse able to find patient's wife and son's phone numbers and contacted both, per patient request. Patient given portable department phone to speak with his wife. Phone returned to charge desk.

## 2022-06-06 NOTE — ED Nurses Note (Signed)
Patient taken to CT via stretcher.

## 2022-06-06 NOTE — ED Nurses Note (Signed)
Patient will be moving to room 13 after CT scan is complete.

## 2022-06-06 NOTE — ED Nurses Note (Signed)
Report given to Jenna, RN at this time.

## 2022-06-06 NOTE — ED Nurses Note (Signed)
Patient discharged home with family.  AVS reviewed with patient/care giver.  A written copy of the AVS and discharge instructions was given to the patient/care giver.  Questions sufficiently answered as needed.  Patient/care giver encouraged to follow up with PCP as indicated.  In the event of an emergency, patient/care giver instructed to call 911 or go to the nearest emergency room.

## 2022-06-06 NOTE — ED Nurses Note (Signed)
Patient ambulatory to restroom with steady gait.

## 2022-06-07 DIAGNOSIS — I4510 Unspecified right bundle-branch block: Secondary | ICD-10-CM

## 2022-06-07 DIAGNOSIS — R001 Bradycardia, unspecified: Secondary | ICD-10-CM

## 2022-06-07 LAB — ECG 12-LEAD
Atrial Rate: 58 {beats}/min
Calculated P Axis: 62 degrees
Calculated R Axis: 62 degrees
Calculated T Axis: -14 degrees
PR Interval: 124 ms
QRS Duration: 114 ms
QT Interval: 434 ms
QTC Calculation: 426 ms
Ventricular rate: 58 {beats}/min
